# Patient Record
Sex: Male | Born: 1970 | Race: White | Hispanic: No | Smoking: Current every day smoker
Health system: Southern US, Community
[De-identification: ages and names within clinical notes are randomized; demographics above are authoritative.]

## PROBLEM LIST (undated history)

## (undated) DIAGNOSIS — F32A Depression, unspecified: Secondary | ICD-10-CM

## (undated) DIAGNOSIS — I639 Cerebral infarction, unspecified: Secondary | ICD-10-CM

## (undated) DIAGNOSIS — M62838 Other muscle spasm: Secondary | ICD-10-CM

## (undated) DIAGNOSIS — F909 Attention-deficit hyperactivity disorder, unspecified type: Secondary | ICD-10-CM

## (undated) DIAGNOSIS — I10 Essential (primary) hypertension: Secondary | ICD-10-CM

## (undated) DIAGNOSIS — M1990 Unspecified osteoarthritis, unspecified site: Secondary | ICD-10-CM

## (undated) DIAGNOSIS — K219 Gastro-esophageal reflux disease without esophagitis: Secondary | ICD-10-CM

## (undated) DIAGNOSIS — R519 Headache, unspecified: Secondary | ICD-10-CM

## (undated) DIAGNOSIS — E785 Hyperlipidemia, unspecified: Secondary | ICD-10-CM

## (undated) DIAGNOSIS — R51 Headache: Secondary | ICD-10-CM

## (undated) DIAGNOSIS — F329 Major depressive disorder, single episode, unspecified: Secondary | ICD-10-CM

## (undated) HISTORY — DX: Depression, unspecified: F32.A

## (undated) HISTORY — DX: Headache: R51

## (undated) HISTORY — DX: Attention-deficit hyperactivity disorder, unspecified type: F90.9

## (undated) HISTORY — DX: Major depressive disorder, single episode, unspecified: F32.9

## (undated) HISTORY — DX: Cerebral infarction, unspecified: I63.9

## (undated) HISTORY — DX: Unspecified osteoarthritis, unspecified site: M19.90

## (undated) HISTORY — DX: Headache, unspecified: R51.9

---

## 2007-03-27 ENCOUNTER — Emergency Department (HOSPITAL_COMMUNITY): Admission: EM | Admit: 2007-03-27 | Discharge: 2007-03-27 | Payer: Self-pay | Admitting: Emergency Medicine

## 2008-10-03 ENCOUNTER — Emergency Department: Payer: Self-pay | Admitting: Emergency Medicine

## 2008-10-07 ENCOUNTER — Emergency Department: Payer: Self-pay | Admitting: Emergency Medicine

## 2009-01-02 ENCOUNTER — Emergency Department: Payer: Self-pay | Admitting: Emergency Medicine

## 2009-07-03 ENCOUNTER — Emergency Department: Payer: Self-pay | Admitting: Emergency Medicine

## 2009-11-20 ENCOUNTER — Emergency Department: Payer: Self-pay | Admitting: Emergency Medicine

## 2010-10-10 ENCOUNTER — Emergency Department: Payer: Self-pay | Admitting: Emergency Medicine

## 2010-10-29 ENCOUNTER — Emergency Department: Payer: Self-pay | Admitting: Emergency Medicine

## 2011-01-15 ENCOUNTER — Emergency Department: Payer: Self-pay | Admitting: Emergency Medicine

## 2011-01-18 ENCOUNTER — Emergency Department: Payer: Self-pay | Admitting: Emergency Medicine

## 2011-02-08 ENCOUNTER — Ambulatory Visit: Payer: Self-pay | Admitting: Pain Medicine

## 2011-02-23 ENCOUNTER — Ambulatory Visit: Payer: Self-pay | Admitting: Pain Medicine

## 2011-02-23 ENCOUNTER — Emergency Department: Payer: Self-pay | Admitting: Unknown Physician Specialty

## 2011-05-08 ENCOUNTER — Emergency Department: Payer: Self-pay | Admitting: Emergency Medicine

## 2011-09-12 ENCOUNTER — Emergency Department: Payer: Self-pay | Admitting: Unknown Physician Specialty

## 2012-03-09 ENCOUNTER — Emergency Department: Payer: Self-pay | Admitting: Emergency Medicine

## 2012-03-09 LAB — COMPREHENSIVE METABOLIC PANEL
Anion Gap: 8 (ref 7–16)
BUN: 17 mg/dL (ref 7–18)
Bilirubin,Total: 0.2 mg/dL (ref 0.2–1.0)
Chloride: 111 mmol/L — ABNORMAL HIGH (ref 98–107)
Co2: 23 mmol/L (ref 21–32)
Creatinine: 1.12 mg/dL (ref 0.60–1.30)
EGFR (African American): >60
EGFR (Non-African Amer.): >60
Osmolality: 286 (ref 275–301)
Potassium: 4 mmol/L (ref 3.5–5.1)
Total Protein: 7.3 g/dL (ref 6.4–8.2)

## 2012-03-09 LAB — CBC WITH DIFFERENTIAL/PLATELET
Basophil #: 0.1 10*3/uL (ref 0.0–0.1)
HCT: 48 % (ref 40.0–52.0)
HGB: 16.9 g/dL (ref 13.0–18.0)
Lymphocyte %: 27.5 %
Monocyte %: 12.6 %
Neutrophil #: 5.8 10*3/uL (ref 1.4–6.5)
Neutrophil %: 55.2 %
RDW: 12.9 % (ref 11.5–14.5)
WBC: 10.5 10*3/uL (ref 3.8–10.6)

## 2012-03-14 ENCOUNTER — Emergency Department: Payer: Self-pay | Admitting: Emergency Medicine

## 2012-10-26 ENCOUNTER — Emergency Department: Payer: Self-pay | Admitting: Emergency Medicine

## 2013-09-07 NOTE — ED Provider Notes (Signed)
 [facility] Emergency Department Provider Note   ED Clinical Impression   Final diagnoses:  Chest pain (Primary)  Fall, initial encounter  Neck pain  Headache  Concussion, with loss of consciousness of unspecified duration, initial encounter    Initial Impression, ED Course, Assessment and Plan   43 year old male with past medical history of chronic pain, hypertension, high cholesterol presents for 2 complaints: #1) neck pain and headache following a fall in the bathtub 2 days ago.  On exam, with midline C-spine tenderness, normal neurologic exam.  Suspect that some of his tenderness is due to chronic pain, we'll obtain CT of the head and C-spine.   #2) one month of intermittent left-sided pressure-like chest discomfort with shortness of breath associated with exertion.  EKG is unremarkable.  Awaiting first troponin.  Plan to admit.  6:56 PM CT head and C-spine are negative.  C-spine was able to be cleared.  I have discussed the plan to admit with the patient and that initial troponin continues to be pending.  The patient states that he does not want to be admitted to the hospital as he does not like to sleep in hospitals.  I discussed with him the risks of going home given the story and risk factors that he has including having a heart attack at any time at home if he has an unstable lesion on the risk of death.  He states he understands this but would like to go home today, he is willing to stay for further evaluation therefore we'll put him on the chest pain obs protocol.  7:46 PM The patient informs me that he no longer wants to stay in the emergency department for workup for his chest pain.  I explained to him that I cannot set up a close stress test for him unless he states for repeat blood work.  He states he will call his primary care physician tomorrow and try to schedule this.  I again explained to him the risks of going home and he accepts this risk.  He will leave AGAINST  MEDICAL ADVICE.  ____________________________________________  Time seen: Sep 07, 2013 6:42 PM  I have reviewed the triage vital signs and the nursing notes.  I have discussed the case with the ED Attending, Dr. Keene.   History   Chief Complaint Neck Pain   HPI  Dustin Cortez is a 43 y.o. male with past medical history of chronic pain, hypertension, high cholesterol presents for neck pain and headache following a fall 2 days ago.  He states that he slipped in the bathtub, falling backward and hitting his head and neck on the edge of the bathtub.  Unknown loss of consciousness as he was alone.  He continues to complain of diffuse moderate headache as well as severe constant neck pain.  No vomiting.  No weakness/numbness of the extremities.  He does tell me that he has been more lightheaded than usual and several times during the day will have episodes where his vision was blurry for several seconds, then returns to normal.  He states he used to be followed by a pain clinic for his neck pain but has not been able to go to for 6 months, he states because he missed an appointment and was fired.  She tells me that for the last month, she has had episodes of left-sided chest discomfort that is described as a pressure, nonradiating, associated with shortness of breath and diaphoresis that resolves with rest.  He states it comes on with exertion.  For the last month he has run out of all of his medications as his Medicaid is not working at this time.  No lower extremity swelling or pain.  He is a smoker.    Past Medical History  Diagnosis Date   Hypertension    Degenerative disc disease    Restless leg     Past Surgical History  Procedure Laterality Date   Foot surgery      Current Outpatient Rx  Name  Route  Sig  Dispense  Refill   albuterol (PROVENTIL HFA;VENTOLIN HFA) 90 mcg/actuation inhaler      Frequency:Q4HPRN   Dosage:2   PUFFS  Instructions:  Note:Dose: 2 PUFFS           cyclobenzaprine (FLEXERIL) 10 MG tablet   Oral   Take 10 mg by mouth.          diazepam (VALIUM) 2 MG tablet   Oral   Take 2 mg by mouth.          gabapentin (NEURONTIN) 300 MG capsule   Oral   Take 300 mg by mouth.          hydrochlorothiazide (HYDRODIURIL) 25 MG tablet   Oral   Take 25 mg by mouth.          nabumetone (RELAFEN) 500 MG tablet   Oral   Take 500 mg by mouth.          oxyCODONE -acetaminophen  (PERCOCET) 5-325 mg per tablet   Oral   Take by mouth.          piroxicam (FELDENE) 20 MG capsule   Oral   Take 20 mg by mouth.           Allergies Review of patient's allergies indicates no known allergies.  No family history on file.  Social History History  Substance Use Topics   Smoking status: Current Some Day Smoker   Smokeless tobacco: Not on file   Alcohol Use: No     Comment: occas    Review of Systems  Constitutional: Negative for fever. Eyes: Negative for visual changes. ENT: Negative for sore throat. Cardiovascular: + for chest pain. Respiratory: + for shortness of breath. Gastrointestinal: Negative for abdominal pain, vomiting or diarrhea. Genitourinary: Negative for dysuria. Musculoskeletal: + for back pain. Skin: Negative for rash. Neurological: Negative for weakness or numbness.   Physical Exam   VITAL SIGNS:   ED Triage Vitals  Enc Vitals Group     BP 09/07/13 1357 135/87 mmHg     Heart Rate 09/07/13 1357 80     Resp 09/07/13 1357 16     Temp 09/07/13 1357 36.5 C (97.7 F)     Temp Source 09/07/13 1357 Temporal     SpO2 09/07/13 1357 99 %     Weight --      Height --      Head Cir --      Peak Flow --      Pain Score --      Pain Loc --      Pain Edu? --      Excl. in GC? --     Constitutional: Alert and oriented. Well appearing and in no distress. Eyes: Conjunctivae are normal. ENT      Head: Normocephalic and atraumatic.      Nose: No congestion.      Mouth/Throat: Mucous  membranes are moist.      Neck: C-collar in  place. There is midline cervical spine tenderness diffusely, no stepoffs or deformities. Cardiovascular: Normal rate, regular rhythm. Normal and symmetric distal pulses are present in all extremities. Respiratory: Normal respiratory effort. Breath sounds are normal. Gastrointestinal: Soft and nontender.  Musculoskeletal: Nontender with normal range of motion in all extremities.      Right lower leg: No tenderness or edema.      Left lower leg: No tenderness or edema. Neurologic: Normal speech and language. Strength 5/5 throughout, SILT, FNF intact bilaterally.  Skin: Skin is warm, dry and intact. No rash noted. Psychiatric: Mood and affect are normal. Speech and behavior are normal.   EKG    Adult ECG Report   Name: Koston Hennes  Age: 43 y.o.  Gender: male    Rate: 70  Rhythm: normal sinus rhythm  Narrative Interpretation: normal axis, normal ST/T waves.    Pertinent labs & imaging results that were available during my care of the patient were reviewed by me and considered in my medical decision making (see chart for details).       Camie Sides, MD Resident 09/09/13 [phone]

## 2014-04-07 ENCOUNTER — Emergency Department: Payer: Self-pay | Admitting: Emergency Medicine

## 2015-02-22 ENCOUNTER — Emergency Department
Admission: EM | Admit: 2015-02-22 | Discharge: 2015-02-22 | Disposition: A | Discharge status: Home / Self Care | Payer: No Typology Code available for payment source | Attending: Emergency Medicine | Admitting: Emergency Medicine

## 2015-02-22 ENCOUNTER — Emergency Department: Payer: No Typology Code available for payment source

## 2015-02-22 ENCOUNTER — Encounter: Payer: Self-pay | Admitting: Emergency Medicine

## 2015-02-22 DIAGNOSIS — S40012A Contusion of left shoulder, initial encounter: Secondary | ICD-10-CM | POA: Insufficient documentation

## 2015-02-22 DIAGNOSIS — Z720 Tobacco use: Secondary | ICD-10-CM | POA: Insufficient documentation

## 2015-02-22 DIAGNOSIS — S29001A Unspecified injury of muscle and tendon of front wall of thorax, initial encounter: Secondary | ICD-10-CM | POA: Diagnosis not present

## 2015-02-22 DIAGNOSIS — Y998 Other external cause status: Secondary | ICD-10-CM | POA: Diagnosis not present

## 2015-02-22 DIAGNOSIS — Y92410 Unspecified street and highway as the place of occurrence of the external cause: Secondary | ICD-10-CM | POA: Diagnosis not present

## 2015-02-22 DIAGNOSIS — Y9389 Activity, other specified: Secondary | ICD-10-CM | POA: Insufficient documentation

## 2015-02-22 DIAGNOSIS — R0781 Pleurodynia: Secondary | ICD-10-CM

## 2015-02-22 DIAGNOSIS — I10 Essential (primary) hypertension: Secondary | ICD-10-CM | POA: Diagnosis not present

## 2015-02-22 DIAGNOSIS — S161XXA Strain of muscle, fascia and tendon at neck level, initial encounter: Secondary | ICD-10-CM | POA: Diagnosis not present

## 2015-02-22 DIAGNOSIS — S199XXA Unspecified injury of neck, initial encounter: Secondary | ICD-10-CM | POA: Diagnosis present

## 2015-02-22 HISTORY — DX: Other muscle spasm: M62.838

## 2015-02-22 HISTORY — DX: Hyperlipidemia, unspecified: E78.5

## 2015-02-22 HISTORY — DX: Essential (primary) hypertension: I10

## 2015-02-22 HISTORY — DX: Gastro-esophageal reflux disease without esophagitis: K21.9

## 2015-02-22 MED ORDER — HYDROCODONE-ACETAMINOPHEN 5-325 MG PO TABS
2.0000 | ORAL_TABLET | Freq: Once | ORAL | Status: AC
  Administered 2015-02-22: 2 via ORAL
  Filled 2015-02-22: qty 2

## 2015-02-22 MED ORDER — OXYCODONE-ACETAMINOPHEN 5-325 MG PO TABS: 1.0000 | ORAL_TABLET | ORAL | Status: AC | PRN

## 2015-02-22 MED ORDER — BACLOFEN 10 MG PO TABS: 10.0000 mg | ORAL_TABLET | Freq: Three times a day (TID) | ORAL | Status: AC

## 2015-02-22 NOTE — ED Notes (Signed)
Discussed discharge instructions, prescriptions, and follow-up care with patient. No questions or concerns at this time. Pt stable at discharge.  
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
## 2018-05-06 ENCOUNTER — Ambulatory Visit: Payer: Medicaid Other | Admitting: Nurse Practitioner

## 2018-05-15 ENCOUNTER — Ambulatory Visit (INDEPENDENT_AMBULATORY_CARE_PROVIDER_SITE_OTHER): Payer: Self-pay | Admitting: Nurse Practitioner

## 2018-05-15 ENCOUNTER — Other Ambulatory Visit: Payer: Self-pay

## 2018-05-15 ENCOUNTER — Encounter: Payer: Self-pay | Admitting: Nurse Practitioner

## 2018-05-15 VITALS — BP 119/81 | HR 69 | Temp 98.2°F | Resp 17 | Ht 66.0 in | Wt 204.2 lb

## 2018-05-15 DIAGNOSIS — F419 Anxiety disorder, unspecified: Secondary | ICD-10-CM

## 2018-05-15 DIAGNOSIS — G894 Chronic pain syndrome: Secondary | ICD-10-CM

## 2018-05-15 DIAGNOSIS — F329 Major depressive disorder, single episode, unspecified: Secondary | ICD-10-CM

## 2018-05-15 DIAGNOSIS — Z7689 Persons encountering health services in other specified circumstances: Secondary | ICD-10-CM

## 2018-05-15 DIAGNOSIS — F32A Depression, unspecified: Secondary | ICD-10-CM

## 2018-05-15 DIAGNOSIS — K219 Gastro-esophageal reflux disease without esophagitis: Secondary | ICD-10-CM

## 2018-05-15 MED ORDER — OMEPRAZOLE 20 MG PO CPDR: 20.0000 mg | DELAYED_RELEASE_CAPSULE | Freq: Every day | ORAL | 3 refills | Status: AC

## 2018-05-15 MED ORDER — DULOXETINE HCL 20 MG PO CPEP: 20.0000 mg | ORAL_CAPSULE | Freq: Every day | ORAL | 4 refills | Status: AC

## 2018-05-15 MED ORDER — DICLOFENAC SODIUM 75 MG PO TBEC: 75.0000 mg | DELAYED_RELEASE_TABLET | Freq: Two times a day (BID) | ORAL | 3 refills | Status: AC
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
## 2018-05-15 NOTE — Patient Instructions (Addendum)
Dustin Cortez,   Thank you for coming in to clinic today.   1. START duloxetine 20 mg once daily.  This should help with anxiety, depression and pain.  It will take 4-8 weeks to see much change on this medication.  2. START omeprazole for stomach pain/GERD.   Call clinic if needing twice daily for improvement of symptoms.  3. START diclofenac 75 mg twice daily as needed.  Skip doses as needed.    Please schedule a follow-up appointment with Wilhelmina McardleLauren Anetria Harwick, AGNP. Return in about 6 weeks (around 06/26/2018) for anxiety, depression and chronic pain.  If you have any other questions or concerns, please feel free to call the clinic or send a message through MyChart. You may also schedule an earlier appointment if necessary.  You will receive a survey after today's visit either digitally by e-mail or paper by [employer] mail. Your experiences and feedback matter to us.  Please respond so we know how we are doing as we provide care for you.   Wilhelmina McardleLauren Manda Holstad, DNP, AGNP-BC Adult Gerontology Nurse Practitioner [facility]  Low Back Pain Exercises See other page with pictures of each exercise.  Start with 1 or 2 of these exercises that you are most comfortable with. Do not do any exercises that cause you significant worsening pain. Some of these may cause some "stretching soreness" but it should go away after you stop the exercise, and get better over time. Gradually increase up to 3-4 exercises as tolerated.  Standing hamstring stretch: Place the heel of your leg on a stool about 15 inches high. Keep your knee straight. Lean forward, bending at the hips until you feel a mild stretch in the back of your thigh. Make sure you do not roll your shoulders and bend at the waist when doing this or you will stretch your lower back instead. Hold the stretch for 15 to 30 seconds. Repeat 3 times. Repeat the same stretch on your other leg.  Cat and camel: Get down on your hands and knees. Let  your stomach sag, allowing your back to curve downward. Hold this position for 5 seconds. Then arch your back and hold for 5 seconds. Do 3 sets of 10.  Quadriped Arm/Leg Raises: Get down on your hands and knees. Tighten your abdominal muscles to stiffen your spine. While keeping your abdominals tight, raise one arm and the opposite leg away from you. Hold this position for 5 seconds. Lower your arm and leg slowly and alternate sides. Do this 10 times on each side.  Pelvic tilt: Lie on your back with your knees bent and your feet flat on the floor. Tighten your abdominal muscles and push your lower back into the floor. Hold this position for 5 seconds, then relax. Do 3 sets of 10.  Partial curl: Lie on your back with your knees bent and your feet flat on the floor. Tighten your stomach muscles and flatten your back against the floor. Tuck your chin to your chest. With your hands stretched out in front of you, curl your upper body forward until your shoulders clear the floor. Hold this position for 3 seconds. Don't hold your breath. It helps to breathe out as you lift your shoulders up. Relax. Repeat 10 times. Build to 3 sets of 10. To challenge yourself, clasp your hands behind your head and keep your elbows out to the side.  Lower trunk rotation: Lie on your back with your knees bent and your feet  flat on the floor. Tighten your abdominal muscles and push your lower back into the floor. Keeping your shoulders down flat, gently rotate your legs to one side, then the other as far as you can. Repeat 10 to 20 times.  Single knee to chest stretch: Lie on your back with your legs straight out in front of you. Bring one knee up to your chest and grasp the back of your thigh. Pull your knee toward your chest, stretching your buttock muscle. Hold this position for 15 to 30 seconds and return to the starting position. Repeat 3 times on each side.  Double knee to chest: Lie on your back with your knees bent and  your feet flat on the floor. Tighten your abdominal muscles and push your lower back into the floor. Pull both knees up to your chest. Hold for 5 seconds and repeat 10 to 20 times.

## 2018-05-18 ENCOUNTER — Encounter: Payer: Self-pay | Admitting: Nurse Practitioner

## 2018-06-02 ENCOUNTER — Other Ambulatory Visit: Payer: Self-pay

## 2018-07-08 ENCOUNTER — Ambulatory Visit: Payer: Medicaid Other | Admitting: Student in an Organized Health Care Education/Training Program

## 2024-04-26 ENCOUNTER — Emergency Department: Payer: Self-pay

## 2024-04-26 ENCOUNTER — Emergency Department
Admission: EM | Admit: 2024-04-26 | Discharge: 2024-04-26 | Disposition: A | Discharge status: Home / Self Care | Payer: Self-pay

## 2024-04-26 ENCOUNTER — Other Ambulatory Visit: Payer: Self-pay

## 2024-04-26 DIAGNOSIS — R079 Chest pain, unspecified: Secondary | ICD-10-CM | POA: Insufficient documentation

## 2024-04-26 DIAGNOSIS — W01198A Fall on same level from slipping, tripping and stumbling with subsequent striking against other object, initial encounter: Secondary | ICD-10-CM | POA: Insufficient documentation

## 2024-04-26 DIAGNOSIS — R55 Syncope and collapse: Secondary | ICD-10-CM | POA: Insufficient documentation

## 2024-04-26 DIAGNOSIS — M79673 Pain in unspecified foot: Secondary | ICD-10-CM

## 2024-04-26 DIAGNOSIS — S32038A Other fracture of third lumbar vertebra, initial encounter for closed fracture: Secondary | ICD-10-CM | POA: Insufficient documentation

## 2024-04-26 DIAGNOSIS — S32009A Unspecified fracture of unspecified lumbar vertebra, initial encounter for closed fracture: Secondary | ICD-10-CM

## 2024-04-26 DIAGNOSIS — R109 Unspecified abdominal pain: Secondary | ICD-10-CM | POA: Insufficient documentation

## 2024-04-26 DIAGNOSIS — Y9389 Activity, other specified: Secondary | ICD-10-CM | POA: Insufficient documentation

## 2024-04-26 DIAGNOSIS — M79671 Pain in right foot: Secondary | ICD-10-CM | POA: Insufficient documentation

## 2024-04-26 DIAGNOSIS — S81812A Laceration without foreign body, left lower leg, initial encounter: Secondary | ICD-10-CM | POA: Insufficient documentation

## 2024-04-26 DIAGNOSIS — I10 Essential (primary) hypertension: Secondary | ICD-10-CM | POA: Insufficient documentation

## 2024-04-26 DIAGNOSIS — R4020 Unspecified coma: Secondary | ICD-10-CM

## 2024-04-26 DIAGNOSIS — W19XXXA Unspecified fall, initial encounter: Secondary | ICD-10-CM

## 2024-04-26 LAB — CBC WITH DIFFERENTIAL/PLATELET
Abs Immature Granulocytes: 0.08 K/uL — ABNORMAL HIGH (ref 0.00–0.07)
Basophils Absolute: 0 K/uL (ref 0.0–0.1)
Basophils Relative: 0 %
Eosinophils Absolute: 0.1 K/uL (ref 0.0–0.5)
Eosinophils Relative: 1 %
HCT: 45.5 % (ref 39.0–52.0)
Hemoglobin: 15.5 g/dL (ref 13.0–17.0)
Immature Granulocytes: 1 %
Lymphocytes Relative: 15 %
Lymphs Abs: 1.3 K/uL (ref 0.7–4.0)
MCH: 33.3 pg (ref 26.0–34.0)
MCHC: 34.1 g/dL (ref 30.0–36.0)
MCV: 97.8 fL (ref 80.0–100.0)
Monocytes Absolute: 0.9 K/uL (ref 0.1–1.0)
Monocytes Relative: 10 %
Neutro Abs: 6.7 K/uL (ref 1.7–7.7)
Neutrophils Relative %: 73 %
Platelets: 318 K/uL (ref 150–400)
RBC: 4.65 MIL/uL (ref 4.22–5.81)
RDW: 12.8 % (ref 11.5–15.5)
WBC: 9.2 K/uL (ref 4.0–10.5)
nRBC: 0 % (ref 0.0–0.2)

## 2024-04-26 LAB — COMPREHENSIVE METABOLIC PANEL WITH GFR
ALT: 11 U/L (ref 0–44)
AST: 15 U/L (ref 15–41)
Albumin: 4 g/dL (ref 3.5–5.0)
Alkaline Phosphatase: 97 U/L (ref 38–126)
Anion gap: 11 (ref 5–15)
BUN: 6 mg/dL (ref 6–20)
CO2: 22 mmol/L (ref 22–32)
Calcium: 8.5 mg/dL — ABNORMAL LOW (ref 8.9–10.3)
Chloride: 104 mmol/L (ref 98–111)
Creatinine, Ser: 0.67 mg/dL (ref 0.61–1.24)
GFR, Estimated: >60 mL/min
Glucose, Bld: 110 mg/dL — ABNORMAL HIGH (ref 70–99)
Potassium: 4.1 mmol/L (ref 3.5–5.1)
Sodium: 136 mmol/L (ref 135–145)
Total Bilirubin: 0.2 mg/dL (ref 0.0–1.2)
Total Protein: 6.7 g/dL (ref 6.5–8.1)

## 2024-04-26 MED ORDER — ONDANSETRON HCL 4 MG/2ML IJ SOLN
4.0000 mg | Freq: Once | INTRAMUSCULAR | Status: AC
  Administered 2024-04-26: 4 mg via INTRAVENOUS
  Filled 2024-04-26: qty 2

## 2024-04-26 MED ORDER — LIDOCAINE 5 % EX PTCH: 1.0000 | MEDICATED_PATCH | CUTANEOUS | 0 refills | Status: AC

## 2024-04-26 MED ORDER — HYDROMORPHONE HCL 1 MG/ML IJ SOLN
0.5000 mg | Freq: Once | INTRAMUSCULAR | Status: AC
  Administered 2024-04-26: 0.5 mg via INTRAVENOUS
  Filled 2024-04-26: qty 0.5

## 2024-04-26 MED ORDER — LIDOCAINE 5 % EX PTCH
1.0000 | MEDICATED_PATCH | CUTANEOUS | Status: DC
  Administered 2024-04-26: 1 via TRANSDERMAL
  Filled 2024-04-26: qty 1

## 2024-04-26 MED ORDER — HYDROMORPHONE HCL 1 MG/ML IJ SOLN
1.0000 mg | Freq: Once | INTRAMUSCULAR | Status: AC
  Administered 2024-04-26: 1 mg via INTRAVENOUS
  Filled 2024-04-26: qty 1

## 2024-04-26 MED ORDER — SODIUM CHLORIDE 0.9 % IV BOLUS
1000.0000 mL | Freq: Once | INTRAVENOUS | Status: AC
  Administered 2024-04-26: 1000 mL via INTRAVENOUS

## 2024-04-26 MED ORDER — OXYCODONE-ACETAMINOPHEN 7.5-325 MG PO TABS
1.0000 | ORAL_TABLET | Freq: Once | ORAL | Status: AC
  Administered 2024-04-26: 1 via ORAL
  Filled 2024-04-26: qty 1

## 2024-04-26 MED ORDER — LIDOCAINE-EPINEPHRINE 2 %-1:100000 IJ SOLN
20.0000 mL | Freq: Once | INTRAMUSCULAR | Status: AC
  Administered 2024-04-26: 20 mL via INTRADERMAL
  Filled 2024-04-26: qty 1

## 2024-04-26 MED ORDER — IOHEXOL 300 MG/ML  SOLN
100.0000 mL | Freq: Once | INTRAMUSCULAR | Status: AC | PRN
  Administered 2024-04-26: 100 mL via INTRAVENOUS

## 2024-04-26 MED ORDER — OXYCODONE-ACETAMINOPHEN 7.5-325 MG PO TABS: 1.0000 | ORAL_TABLET | Freq: Four times a day (QID) | ORAL | 0 refills | Status: AC | PRN

## 2024-04-26 NOTE — ED Notes (Signed)
 Pt in scans.

## 2024-04-26 NOTE — Discharge Instructions (Addendum)
 You have several sutures that will need to be removed in 7 to 10 days.  Do not drive or operate machinery while taking your opioid medications.  Please follow-up with your primary care physician as soon as possible.  Return with any acutely worsening symptoms or any other emergency. -- RETURN PRECAUTIONS & AFTERCARE: (ENGLISH) RETURN PRECAUTIONS: Return immediately to the emergency department or see/call your doctor if you feel worse, weak or have changes in speech or vision, are short of breath, have fever, vomiting, pain, bleeding or dark stool, trouble urinating or any new issues. Return here or see/call your doctor if not improving as expected for your suspected condition. FOLLOW-UP CARE: Call your doctor and/or any doctors we referred you to for more advice and to make an appointment. Do this today, tomorrow or after the weekend. Some doctors only take PPO insurance so if you have HMO insurance you may want to contact your HMO or your regular doctor for referral to a specialist within your plan. Either way tell the doctor's office that it was a referral from the emergency department so you get the soonest possible appointment.  YOUR TEST RESULTS: Take result reports of any blood or urine tests, imaging tests and EKG's to your doctor and any referral doctor. Have any abnormal tests repeated. Your doctor or a referral doctor can let you know when this should be done. Also make sure your doctor contacts this hospital to get any test results that are not currently available such as cultures or special tests for infection and final imaging reports, which are often not available at the time you leave the ER but which may list additional important findings that are not documented on the preliminary report. BLOOD PRESSURE: If your blood pressure was greater than 120/80 have your blood pressure rechecked within 1 to 2 weeks. MEDICATION SIDE EFFECTS: Do not drive, walk, bike, take the bus, etc. if you have  received or are being prescribed any sedating medications such as those for pain or anxiety or certain antihistamines like Benadryl. If you have been give one of these here get a taxi home or have a friend drive you home. Ask your pharmacist to counsel you on potential side effects of any new medication

## 2024-04-26 NOTE — ED Triage Notes (Signed)
 Pt to Ed via [facility] from home. Pt reports this morning was on his back deck playing with the dog and twisted wrong and hit the railing and it broke causing him to fall approximately 36ft. Pt reports waking up on ground with severe right sided rib/back pain, left leg laceration and SOB.

## 2024-04-26 NOTE — ED Provider Notes (Signed)
 "  [facility] Provider Note    Event Date/Time   First MD Initiated Contact with Patient 04/26/24 0848     (approximate)   History   Fall   HPI  Dustin Cortez is a 53 y.o. male  ast medical history of chronic pain, hypertension, high cholesterol who presents to the emergency department with headache, chest pain, back pain, right foot pain and left lower extremity pain after a fall from a balcony.  Patient was in his usual state of health when his dog pulled him and he lost his balance and went over the rail of a balcony.  There was a possible loss of consciousness.  He has been able to ambulate since this incident but it has been painful.  Denies any sensation changes in any of his extremities.  He reports that his last tetanus shot was done just 3 years ago.  He is not on any blood thinning medication.  Denies any shortness of breath.  His wife presents with him and contributes to history     Physical Exam   Triage Vital Signs: ED Triage Vitals [04/26/24 0845]  Encounter Vitals Group     BP (!) 155/91     Girls Systolic BP Percentile      Girls Diastolic BP Percentile      Boys Systolic BP Percentile      Boys Diastolic BP Percentile      Pulse Rate 71     Resp 20     Temp 98 F (36.7 C)     Temp src      SpO2 98 %     Weight      Height      Head Circumference      Peak Flow      Pain Score 10     Pain Loc      Pain Education      Exclude from Growth Chart     Most recent vital signs: Vitals:   04/26/24 1244 04/26/24 1400  BP:  137/86  Pulse:  64  Resp:    Temp: 98 F (36.7 C)   SpO2:  98%    Nursing Triage Note reviewed. Vital signs reviewed and patients oxygen saturation is normoxic  General: Patient is well nourished, well developed, awake and alert, yelling out in pain Head: Normocephalic, no scalp lacerations or abrasions Eyes: Normal inspection, extraocular muscles intact, no conjunctival pallor Ear, nose, throat: Normal  external exam Neck: Normal range of motion, no C-spine tenderness to palpation Respiratory: Patient is in no respiratory distress, lungs CTAB Mild tenderness to palpation over the right anterior chest but no crepitus or ecchymosis Cardiovascular: Patient is not tachycardic, RR GI: Abd soft, tender to palpation in right lower quadrant with no guarding or rebound  Back: Normal inspection of the back with good strength and range of motion throughout all ext No step-offs but patient is tender to palpation over his T and L-spine Extremities: pulses intact with good cap refills, no LE pitting edema or calf tenderness Patient does have a stellate laceration over the left anterior tibia Neuro: The patient is alert and oriented to person, place, and time, appropriately conversive, with 5/5 bilat UE/LE strength, no gross motor or sensory defects noted. Coordination appears to be adequate. Psych: normal mood and affect, no SI or HI  ED Results / Procedures / Treatments   Labs (all labs ordered are listed, but only abnormal results are displayed) Labs Reviewed  CBC  WITH DIFFERENTIAL/PLATELET - Abnormal; Notable for the following components:      Result Value   Abs Immature Granulocytes 0.08 (*)    All other components within normal limits  COMPREHENSIVE METABOLIC PANEL WITH GFR - Abnormal; Notable for the following components:   Glucose, Bld 110 (*)    Calcium 8.5 (*)    All other components within normal limits     EKG EKG and rhythm strip are interpreted by myself:   EKG: [Normal sinus rhythm] at heart rate of 73, normal QRS duration, QTc 427, nonspecific ST segments and T waves no ectopy EKG not consistent with Acute STEMI Rhythm strip: NSR in lead II   RADIOLOGY CT head: No intracranial hemorrhage on my independent review interpretation radiologist agrees CT C-spine: No acute abnormality CT chest abdomen pelvis with T and L-spine reconstructions: Patient does have a lumbar TP fracture  on my independent review interpretation radiologist agrees X-rays of the tibia-fibula foot: No acute abnormality    PROCEDURES:  Critical Care performed: No  .Laceration Repair  Date/Time: 04/26/2024 3:47 PM  Performed by: Nicholaus Rolland BRAVO, MD Authorized by: Nicholaus Rolland BRAVO, MD   Consent:    Consent obtained:  Verbal   Consent given by:  Patient   Risks discussed:  Infection, need for additional repair, nerve damage, pain, poor cosmetic result and poor wound healing   Alternatives discussed:  No treatment and delayed treatment Universal protocol:    Procedure explained and questions answered to patient or proxy's satisfaction: yes     Immediately prior to procedure, a time out was called: yes     Patient identity confirmed:  Verbally with patient and arm band Anesthesia:    Anesthesia method:  Local infiltration   Local anesthetic:  Lidocaine  2% WITH epi Laceration details:    Location:  Leg   Leg location:  L lower leg   Length (cm):  6   Depth (mm):  2 Pre-procedure details:    Preparation:  Patient was prepped and draped in usual sterile fashion and imaging obtained to evaluate for foreign bodies Exploration:    Hemostasis achieved with:  Epinephrine  and direct pressure   Imaging obtained: x-ray     Wound exploration: wound explored through full range of motion and entire depth of wound visualized     Wound extent: no foreign body     Contaminated: no   Treatment:    Area cleansed with:  Saline and povidone-iodine   Irrigation solution:  Sterile saline   Irrigation volume:  100   Irrigation method:  Syringe   Visualized foreign bodies/material removed: no     Debridement:  None   Undermining:  None Skin repair:    Repair method:  Sutures   Suture size:  4-0   Suture material:  Prolene   Suture technique:  Horizontal mattress and simple interrupted   Number of sutures:  6 Approximation:    Approximation:  Loose Repair type:    Repair type:   Intermediate Post-procedure details:    Dressing:  Non-adherent dressing and tube gauze   Procedure completion:  Tolerated well, no immediate complications    MEDICATIONS ORDERED IN ED: Medications  lidocaine  (LIDODERM ) 5 % 1 patch (1 patch Transdermal Patch Applied 04/26/24 1401)  HYDROmorphone  (DILAUDID ) injection 1 mg (1 mg Intravenous Given 04/26/24 0954)  ondansetron  (ZOFRAN ) injection 4 mg (4 mg Intravenous Given 04/26/24 0955)  sodium chloride  0.9 % bolus 1,000 mL (0 mLs Intravenous Stopped 04/26/24 1215)  lidocaine -EPINEPHrine  (XYLOCAINE   W/EPI) 2 %-1:100000 (with pres) injection 20 mL (20 mLs Intradermal Given 04/26/24 1040)  HYDROmorphone  (DILAUDID ) injection 0.5 mg (0.5 mg Intravenous Given 04/26/24 1215)  iohexol  (OMNIPAQUE ) 300 MG/ML solution 100 mL (100 mLs Intravenous Contrast Given 04/26/24 1130)  oxyCODONE -acetaminophen  (PERCOCET) 7.5-325 MG per tablet 1 tablet (1 tablet Oral Given 04/26/24 1401)     IMPRESSION / MDM / ASSESSMENT AND PLAN / ED COURSE                                Differential diagnosis includes, but is not limited to, intracranial hemorrhage, spinal fracture, rib fractures, pulmonary contusion, visceral organ bleed, laceration  ED course: Patient arrives screening complaint and has multiple complaints.  Consequently decision made to treat patient's pain with IV Dilaudid  and IV fluids.  Patient went for full trauma scans including CT head CT C-spine CT chest abdomen pelvis with IV contrast with T and L-spine reconstructions.  These were negative except for a transverse process lumbar fracture.  Plain films of patient's affected joints were negative for acute abnormality.  Patient's laceration was repaired.  No Tdap needed given recent administration.  Patient has ambulated and taken p.o.  He feels comfortable returning home.  He was sent home with prescription for lidocaine  patches and Percocet and he will follow-up with his primary care  physician   Clinical Course as of 04/26/24 1550  Sun Apr 26, 2024  0859 I have attempted to examine this patient, but he is not in the room.  [HD]  9087 I introduced myself to his wife [HD]  1002 DG Tibia/Fibula Left No acute fracture [HD]  1002 DG Lumbar Spine 2-3 Views No acute fracture [HD]  1002 DG Chest 2 View No acute fracture [HD]  1256 CT T-SPINE NO CHARGE No acute abnormality [HD]  1257 DG Foot Complete Right No acute abnormality consistent with arthritis [HD]  1316 CT L-SPINE NO CHARGE Right transverse process fracture but nothing along the base [HD]  1318 CT CHEST ABDOMEN PELVIS W CONTRAST No acute abnormality other than the right TP fracture [HD]  1321 Patient reassessed.  I explained the results to him and he voiced understanding.  His wife will drive him home.  Will send him with scripts for Percocet and lidocaine  patches.  He will follow-up with his primary care physician for suture removal.  All questions answered and patient requested discharge [HD]    Clinical Course User Index [HD] Nicholaus Rolland BRAVO, MD   At time of discharge there is no evidence of acute life, limb, vision, or fertility threat. Patient has stable vital signs, pain is well controlled, patient is ambulatory and p.o. tolerant.  Discharge instructions were completed using the EPIC system. I would refer you to those at this time. All warnings prescriptions follow-up etc. were discussed in detail with the patient. Patient indicates understanding and is agreeable with this plan. All questions answered.  Patient is made aware that they may return to the emergency department for any worsening or new condition or for any other emergency.  -- Risk: 5 This patient has a high risk of morbidity due to further diagnostic testing or treatment. Rationale: This patients evaluation and management involve a high risk of morbidity due to the potential severity of presenting symptoms, need for diagnostic testing,  and/or initiation of treatment that may require close monitoring. The differential includes conditions with potential for significant deterioration or requiring escalation of care. Treatment  decisions in the ED, including medication administration, procedural interventions, or disposition planning, reflect this level of risk. COPA: 5 The patient has the following acute or chronic illness/injury that poses a possible threat to life or bodily function: [X] : The patient has a potentially serious acute condition or an acute exacerbation of a chronic illness requiring urgent evaluation and management in the Emergency Department. The clinical presentation necessitates immediate consideration of life-threatening or function-threatening diagnoses, even if they are ultimately ruled out.   FINAL CLINICAL IMPRESSION(S) / ED DIAGNOSES   Final diagnoses:  Fall, initial encounter  Lumbar transverse process fracture, closed, initial encounter (HCC)  Pain of foot, unspecified laterality  Loss of consciousness (HCC)  Laceration of left lower extremity, initial encounter     Rx / DC Orders   ED Discharge Orders          Ordered    oxyCODONE -acetaminophen  (PERCOCET) 7.5-325 MG tablet  Every 6 hours PRN        04/26/24 1325    lidocaine  (LIDODERM ) 5 %  Every 24 hours        04/26/24 1325    Ambulatory Referral to Primary Care (Establish Care)        04/26/24 1325             Note:  This document was prepared using Dragon voice recognition software and may include unintentional dictation errors.   Nicholaus Rolland BRAVO, MD 04/26/24 1550  "

## 2024-04-26 NOTE — ED Notes (Signed)
 Scanner and printer not working for lidocaine  and also blood labels. IT ticket placed and chart labels used for lab tubes. Provider at bedside to suture.
